# Patient Record
Sex: Female | Born: 2000 | Race: White | Hispanic: No | Marital: Single
Health system: Southern US, Community
[De-identification: ages and names within clinical notes are randomized; demographics above are authoritative.]

---
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
## 2019-03-17 IMAGING — CR DG FOOT COMPLETE 3+V*R*
3 series · 3 of 3 positions shown · non-contrast
Comparison: None.

CLINICAL DATA: Pain following fall

EXAM:
RIGHT FOOT COMPLETE - 3+ VIEW

[x foot lat right]
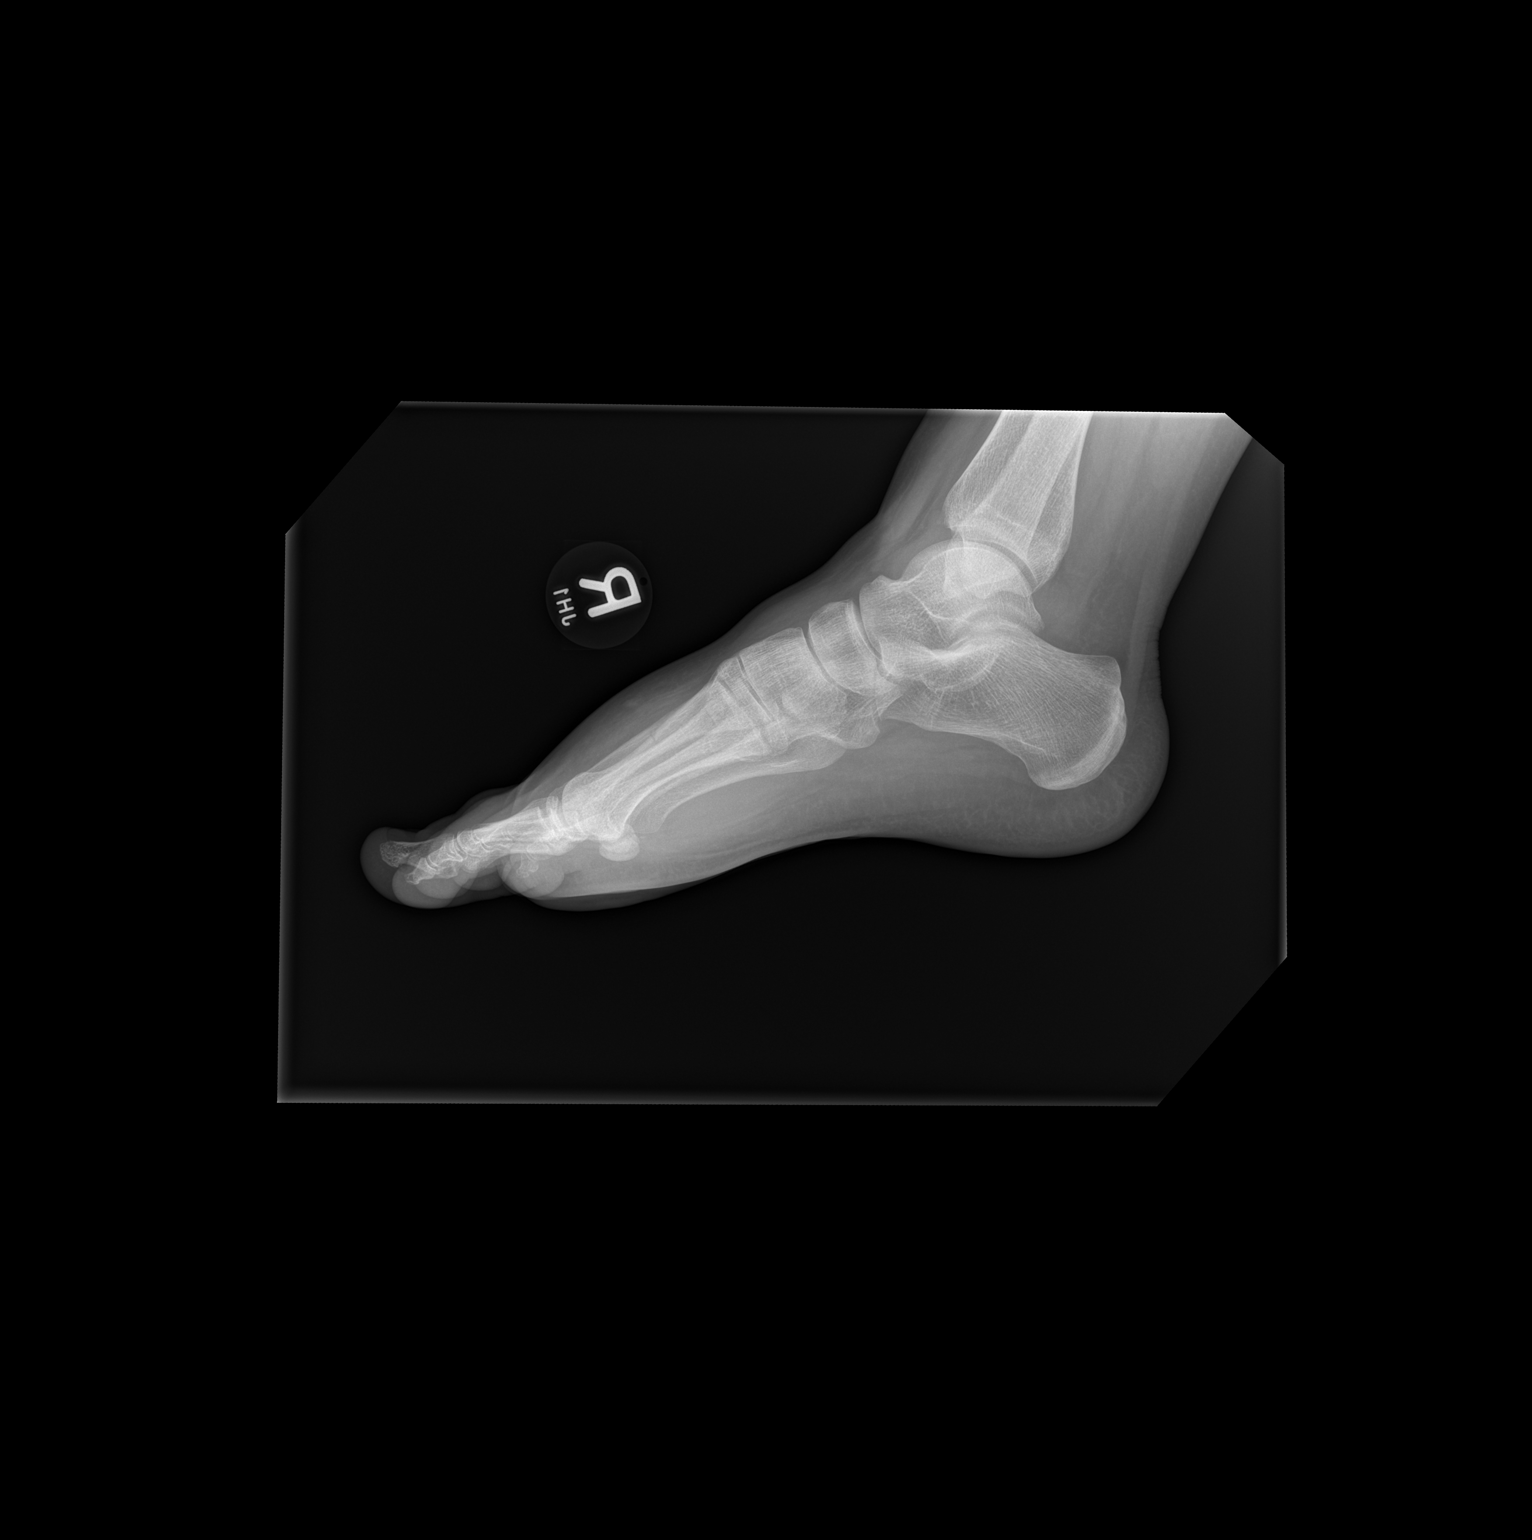

[x foot ap right]
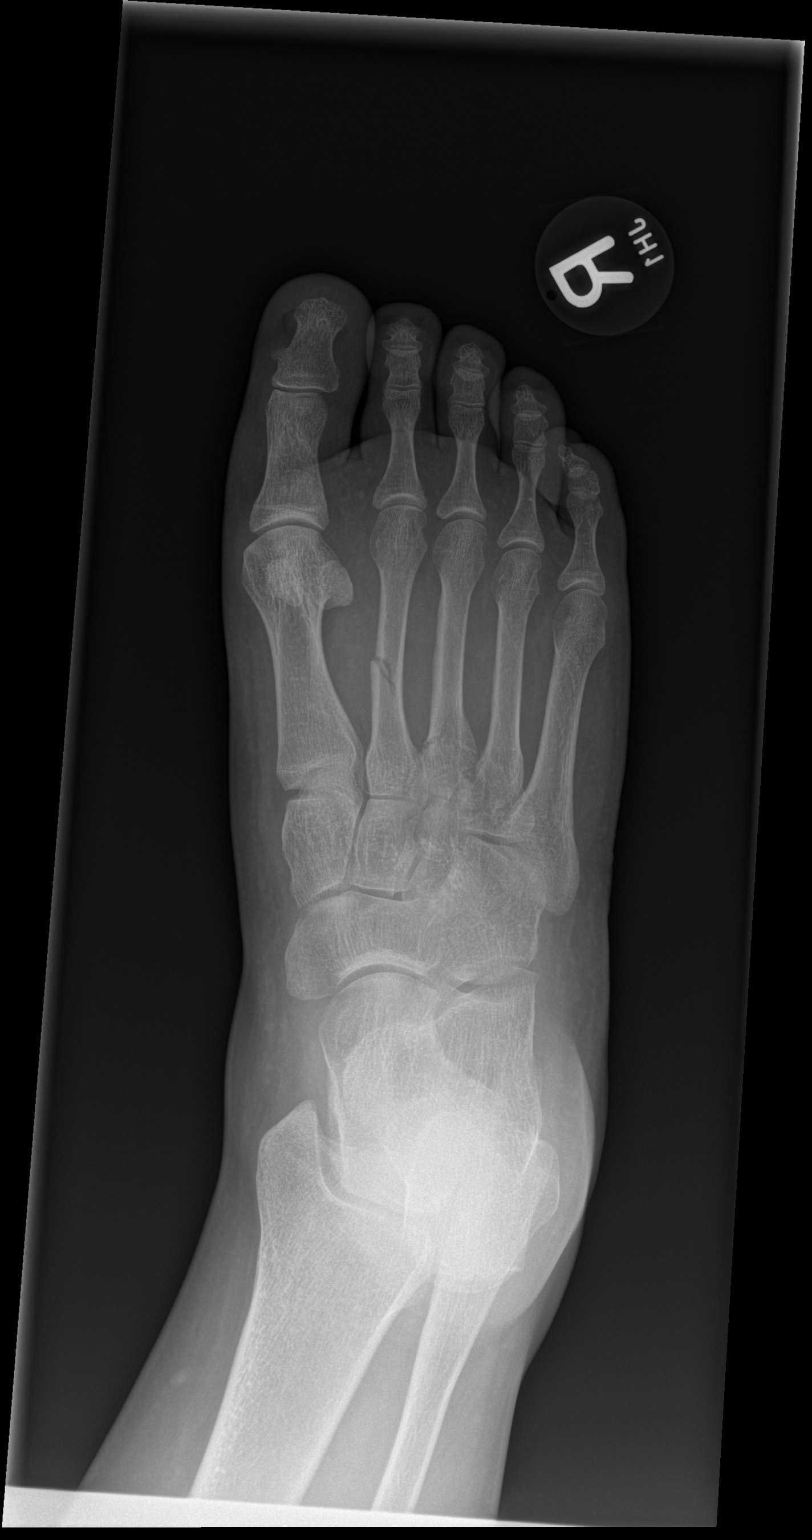

[x foot obl right]
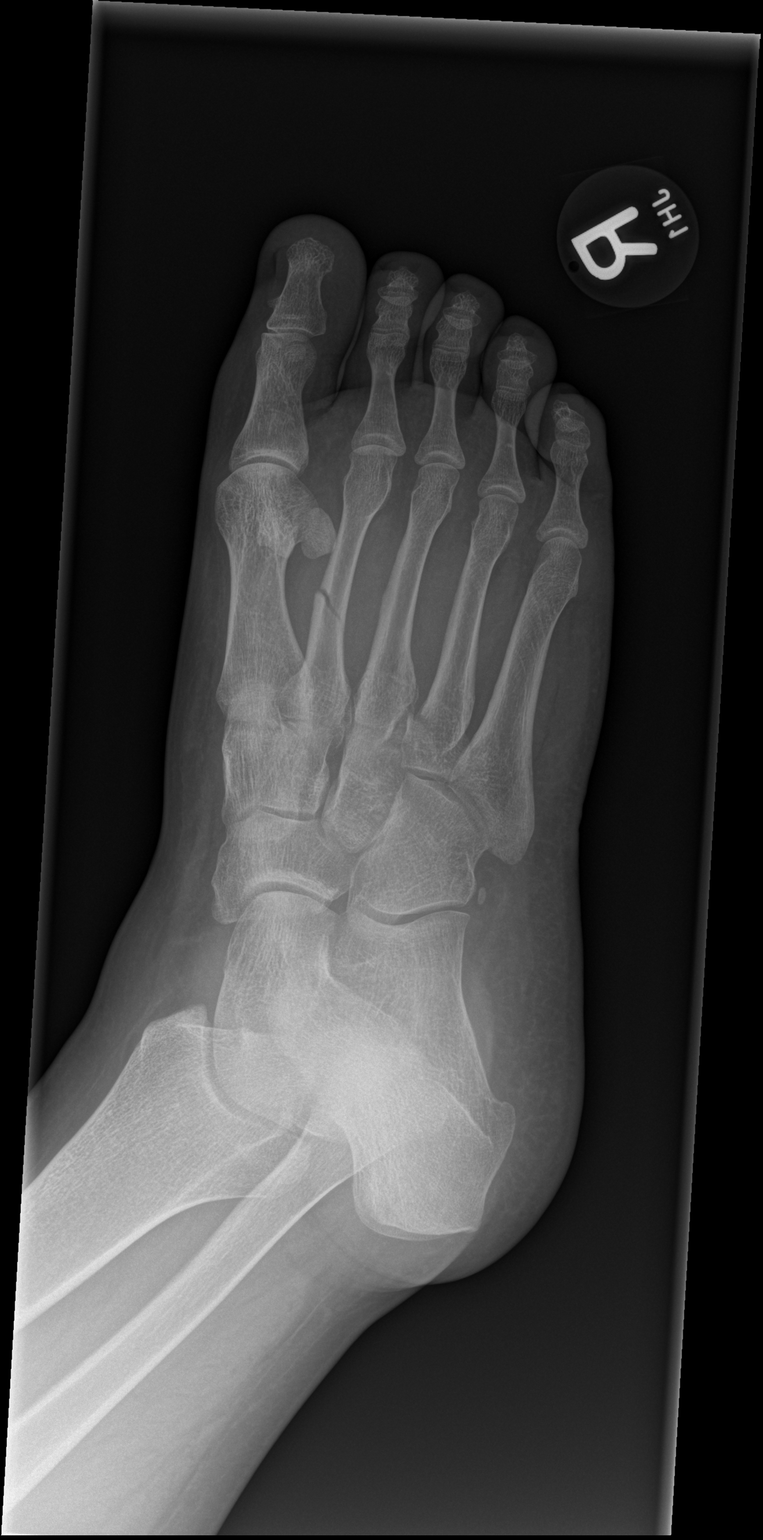

[3 of 3 positions shown; findings below may reference images not displayed]

FINDINGS: Frontal, oblique, and lateral views obtained. There is soft tissue
swelling. There is an obliquely oriented fracture through the
midportion of the second metatarsal. There is an incomplete fracture
through the medial aspect of the proximal third metatarsal. There is
an obliquely oriented fracture through the fourth proximal
metatarsal. Alignment at these fracture sites is overall near
anatomic. No other fractures are evident. No dislocation. The joint
spaces appear normal. No erosive change.
IMPRESSION: Fracture of the midportion of the second metatarsal, proximal aspect
of third metatarsal, and proximal aspect of fourth metatarsal with
fracture fragments in overall near anatomic in these areas. No other
fractures are evident. No dislocation. There is soft tissue swelling
in the midfoot region.
